# Patient Record
Sex: Male | Born: 1965 | Race: White | Hispanic: No
Health system: Southern US, Community
[De-identification: ages and names within clinical notes are randomized; demographics above are authoritative.]

## PROBLEM LIST (undated history)

## (undated) DIAGNOSIS — I2510 Atherosclerotic heart disease of native coronary artery without angina pectoris: Secondary | ICD-10-CM

## (undated) DIAGNOSIS — E785 Hyperlipidemia, unspecified: Secondary | ICD-10-CM

## (undated) DIAGNOSIS — E7800 Pure hypercholesterolemia, unspecified: Secondary | ICD-10-CM

## (undated) DIAGNOSIS — F419 Anxiety disorder, unspecified: Secondary | ICD-10-CM

## (undated) HISTORY — DX: Hyperlipidemia, unspecified: E78.5

## (undated) HISTORY — PX: NERVE GRAFT: SHX721

## (undated) HISTORY — DX: Pure hypercholesterolemia, unspecified: E78.00

## (undated) HISTORY — DX: Atherosclerotic heart disease of native coronary artery without angina pectoris: I25.10

## (undated) HISTORY — DX: Anxiety disorder, unspecified: F41.9

---

## 2018-03-01 DIAGNOSIS — S80862A Insect bite (nonvenomous), left lower leg, initial encounter: Secondary | ICD-10-CM | POA: Diagnosis not present

## 2018-06-20 DIAGNOSIS — Z125 Encounter for screening for malignant neoplasm of prostate: Secondary | ICD-10-CM | POA: Diagnosis not present

## 2018-06-20 DIAGNOSIS — E7800 Pure hypercholesterolemia, unspecified: Secondary | ICD-10-CM | POA: Diagnosis not present

## 2018-06-20 DIAGNOSIS — Z0000 Encounter for general adult medical examination without abnormal findings: Secondary | ICD-10-CM | POA: Diagnosis not present

## 2018-06-20 DIAGNOSIS — Z131 Encounter for screening for diabetes mellitus: Secondary | ICD-10-CM | POA: Diagnosis not present

## 2018-06-28 DIAGNOSIS — Z0000 Encounter for general adult medical examination without abnormal findings: Secondary | ICD-10-CM | POA: Diagnosis not present

## 2018-07-04 DIAGNOSIS — J209 Acute bronchitis, unspecified: Secondary | ICD-10-CM | POA: Diagnosis not present

## 2018-10-03 DIAGNOSIS — K625 Hemorrhage of anus and rectum: Secondary | ICD-10-CM | POA: Diagnosis not present

## 2019-04-07 DIAGNOSIS — R202 Paresthesia of skin: Secondary | ICD-10-CM | POA: Diagnosis not present

## 2019-04-07 DIAGNOSIS — B078 Other viral warts: Secondary | ICD-10-CM | POA: Diagnosis not present

## 2019-04-07 DIAGNOSIS — D229 Melanocytic nevi, unspecified: Secondary | ICD-10-CM | POA: Diagnosis not present

## 2019-05-19 DIAGNOSIS — Z20822 Contact with and (suspected) exposure to covid-19: Secondary | ICD-10-CM | POA: Diagnosis not present

## 2019-05-25 DIAGNOSIS — U071 COVID-19: Secondary | ICD-10-CM | POA: Diagnosis not present

## 2019-06-05 DIAGNOSIS — Z03818 Encounter for observation for suspected exposure to other biological agents ruled out: Secondary | ICD-10-CM | POA: Diagnosis not present

## 2019-09-29 DIAGNOSIS — K921 Melena: Secondary | ICD-10-CM | POA: Diagnosis not present

## 2019-12-15 DIAGNOSIS — M25512 Pain in left shoulder: Secondary | ICD-10-CM | POA: Diagnosis not present

## 2019-12-15 DIAGNOSIS — M5412 Radiculopathy, cervical region: Secondary | ICD-10-CM | POA: Diagnosis not present

## 2020-05-17 DIAGNOSIS — E7800 Pure hypercholesterolemia, unspecified: Secondary | ICD-10-CM | POA: Diagnosis not present

## 2020-05-17 DIAGNOSIS — K644 Residual hemorrhoidal skin tags: Secondary | ICD-10-CM | POA: Diagnosis not present

## 2020-05-17 DIAGNOSIS — F419 Anxiety disorder, unspecified: Secondary | ICD-10-CM | POA: Diagnosis not present

## 2020-09-10 DIAGNOSIS — K602 Anal fissure, unspecified: Secondary | ICD-10-CM | POA: Diagnosis not present

## 2020-09-10 DIAGNOSIS — K625 Hemorrhage of anus and rectum: Secondary | ICD-10-CM | POA: Diagnosis not present

## 2020-09-10 DIAGNOSIS — K649 Unspecified hemorrhoids: Secondary | ICD-10-CM | POA: Diagnosis not present

## 2020-10-11 DIAGNOSIS — K602 Anal fissure, unspecified: Secondary | ICD-10-CM | POA: Diagnosis not present

## 2020-10-11 DIAGNOSIS — K625 Hemorrhage of anus and rectum: Secondary | ICD-10-CM | POA: Diagnosis not present

## 2020-11-13 DIAGNOSIS — Z131 Encounter for screening for diabetes mellitus: Secondary | ICD-10-CM | POA: Diagnosis not present

## 2020-11-13 DIAGNOSIS — Z125 Encounter for screening for malignant neoplasm of prostate: Secondary | ICD-10-CM | POA: Diagnosis not present

## 2020-11-13 DIAGNOSIS — E7800 Pure hypercholesterolemia, unspecified: Secondary | ICD-10-CM | POA: Diagnosis not present

## 2020-11-18 DIAGNOSIS — Z0000 Encounter for general adult medical examination without abnormal findings: Secondary | ICD-10-CM | POA: Diagnosis not present

## 2020-12-04 DIAGNOSIS — K921 Melena: Secondary | ICD-10-CM | POA: Diagnosis not present

## 2020-12-04 DIAGNOSIS — K649 Unspecified hemorrhoids: Secondary | ICD-10-CM | POA: Diagnosis not present

## 2021-01-23 ENCOUNTER — Other Ambulatory Visit (HOSPITAL_COMMUNITY): Payer: Self-pay | Admitting: Family Medicine

## 2021-03-03 DIAGNOSIS — K625 Hemorrhage of anus and rectum: Secondary | ICD-10-CM | POA: Diagnosis not present

## 2021-03-21 ENCOUNTER — Ambulatory Visit (HOSPITAL_COMMUNITY)
Admission: RE | Admit: 2021-03-21 | Discharge: 2021-03-21 | Disposition: A | Discharge status: Home / Self Care | Payer: Self-pay | Source: Ambulatory Visit | Attending: Family Medicine | Admitting: Family Medicine

## 2021-03-21 ENCOUNTER — Other Ambulatory Visit: Payer: Self-pay

## 2021-03-21 DIAGNOSIS — Z136 Encounter for screening for cardiovascular disorders: Secondary | ICD-10-CM | POA: Insufficient documentation

## 2021-04-10 DIAGNOSIS — K648 Other hemorrhoids: Secondary | ICD-10-CM | POA: Diagnosis not present

## 2021-05-09 DIAGNOSIS — K641 Second degree hemorrhoids: Secondary | ICD-10-CM | POA: Diagnosis not present

## 2021-05-16 ENCOUNTER — Other Ambulatory Visit: Payer: Self-pay

## 2021-05-16 ENCOUNTER — Ambulatory Visit: Payer: BC Managed Care – PPO | Admitting: Cardiology

## 2021-05-16 VITALS — BP 130/80 | HR 77 | Ht 67.0 in | Wt 248.0 lb

## 2021-05-16 DIAGNOSIS — Z789 Other specified health status: Secondary | ICD-10-CM

## 2021-05-16 DIAGNOSIS — E785 Hyperlipidemia, unspecified: Secondary | ICD-10-CM

## 2021-05-16 DIAGNOSIS — E782 Mixed hyperlipidemia: Secondary | ICD-10-CM | POA: Diagnosis not present

## 2021-05-16 DIAGNOSIS — I2510 Atherosclerotic heart disease of native coronary artery without angina pectoris: Secondary | ICD-10-CM

## 2021-05-16 DIAGNOSIS — I2583 Coronary atherosclerosis due to lipid rich plaque: Secondary | ICD-10-CM | POA: Diagnosis not present

## 2021-05-16 MED ORDER — ASPIRIN EC 81 MG PO TBEC
81.0000 mg | DELAYED_RELEASE_TABLET | Freq: Every day | ORAL | 3 refills | Status: AC
Start: 2021-05-16 — End: ?

## 2021-05-16 NOTE — Progress Notes (Signed)
Cardiology Office Note:    Date:  05/16/2021   ID:  Scott ChiquitoJonathan Vandunk, DOB Mar 26, 1966, MRN [id number]  PCP:  Trey SailorsPa, [facility]   [facility] Providers Cardiologist:  Donato SchultzMark Saragrace Selke, MD     Referring MD: Darrow BussingKoirala, Dibas, MD    History of Present Illness:    Scott Stephens is a 56 y.o. male here for the evaluation of elevated coronary artery calcium score as well as statin intolerance at the request of Dr. Docia ChuckKoirala.  Coronary calcium score personally reviewed and interpreted 03/21/2021: Left main: 0   Left anterior descending artery: 53.6   Left circumflex artery: 0   Right coronary artery: 0   Total: 53.6   Percentile: 74th  Outside labs-hemoglobin 13.2  He has had trouble in the past taking statin medications.  He has tried simvastatin 20 mg twice a week.  He has had muscle aches with Lipitor as well as Crestor.  States that he had weakness as well when going up stairs if he took it the day before while working at [employer] for instance.  He is quite active, walks his dogs, 3 miles at times in the woods.  No anginal symptoms.  Mild shortness of breath with activity which he attributes to weight gain.  He does vape.  Does not smoke tobacco.  Has had chronic intermittent rectal bleeding followed by GI.  Seems to be under good control after banding of what sounds like a hemorrhoid.  Anxiety has been stable on sertraline.  Father is alive in his 5490s, he has had TAVR secondary to calcification of aortic valve.  His mother died at age 55 from myocardial infarction.  He has a brother and a sister, sister with heart disease in her late 6050s.  Past Medical History:  Diagnosis Date   Anxiety    CAD (coronary artery disease)    Hypercholesteremia    Hyperlipidemia       Current Medications: Current Meds  Medication Sig   aspirin EC 81 MG tablet Take 1 tablet (81 mg total) by mouth daily. Swallow whole.   diltiazem 2 % GEL Apply 1 application topically 2 (two)  times daily.   hydrocortisone (ANUSOL-HC) 2.5 % rectal cream Place 1 application rectally 2 (two) times daily.   ibuprofen (ADVIL) 200 MG tablet Take 200 mg by mouth every 6 (six) hours as needed.   sertraline (ZOLOFT) 50 MG tablet Take 50 mg by mouth daily.   simvastatin (ZOCOR) 20 MG tablet Take 20 mg by mouth daily.     Allergies:   Crestor [rosuvastatin] and Lipitor [atorvastatin]   Social History   Socioeconomic History   Marital status: Unknown    Spouse name: Not on file   Number of children: Not on file   Years of education: Not on file   Highest education level: Not on file  Occupational History   Not on file  Tobacco Use   Smoking status: Not on file   Smokeless tobacco: Not on file  Substance and Sexual Activity   Alcohol use: Not on file   Drug use: Not on file   Sexual activity: Not on file  Other Topics Concern   Not on file  Social History Narrative   Not on file   Social Determinants of Health   Financial Resource Strain: Not on file  Food Insecurity: Not on file  Transportation Needs: Not on file  Physical Activity: Not on file  Stress: Not on file  Social Connections: Not  on file     Family History: The patient's family history includes Diabetes in his father; Heart Problems in his father.  ROS:   Please see the history of present illness.    Denies any fevers chills nausea vomiting syncope bleeding all other systems reviewed and are negative.  EKGs/Labs/Other Studies Reviewed:    The following studies were reviewed today: Coronary calcium score personally reviewed and interpreted with him so he could visualize his LAD.  EKG:  EKG is  ordered today.  The ekg ordered today demonstrates sinus rhythm 77 no other abnormalities  Recent Labs: No results found for requested labs within last 8760 hours.  Recent Lipid Panel No results found for: CHOL, TRIG, HDL, CHOLHDL, VLDL, LDLCALC, LDLDIRECT   Risk Assessment/Calculations:               Physical Exam:    VS:  BP 130/80 (BP Location: Left Arm, Patient Position: Sitting, Cuff Size: Normal)    Pulse 77    Ht 5\' 7"  (1.702 m)    Wt 248 lb (112.5 kg)    SpO2 93%    BMI 38.84 kg/m     Wt Readings from Last 3 Encounters:  05/16/21 248 lb (112.5 kg)     GEN:  Well nourished, well developed in no acute distress HEENT: Normal NECK: No JVD; No carotid bruits LYMPHATICS: No lymphadenopathy CARDIAC: RRR, no murmurs, no rubs, gallops RESPIRATORY:  Clear to auscultation without rales, wheezing or rhonchi  ABDOMEN: Protuberant soft, non-tender, non-distended MUSCULOSKELETAL:  No edema; No deformity  SKIN: Warm and dry NEUROLOGIC:  Alert and oriented x 3 PSYCHIATRIC:  Normal affect   ASSESSMENT:    1. Coronary artery disease involving native coronary artery of native heart without angina pectoris   2. Statin intolerance   3. Hyperlipidemia, unspecified hyperlipidemia type   4. Coronary artery disease due to lipid rich plaque   5. Mixed hyperlipidemia    PLAN:    In order of problems listed above:  Coronary artery disease involving native coronary artery of native heart without angina pectoris Calcium score 54 in the LAD.  Reviewed with him the images.  Described the pathophysiology behind this.  His percentile is 74th percentile.  He has had trouble in the past with statins Lipitor or Crestor for instance have caused muscle weakness and discomfort the day after he takes them.  He is trying to take simvastatin 20 mg 2 to maybe 3 times a week if possible.  Because of this, I will have him sit down with our lipid clinic for alternative treatment strategies.  I would like for his LDL goal to be less than 70 given his coronary artery disease/calcification.  Thankfully, he is not having any anginal symptoms.  Of course if symptoms do develop, we will have low threshold for further cardiac testing.  For now aggressive medical management/prevention, stabilization.  I would like for him  to be on an aspirin 81 mg as well.  Watch for any signs of further rectal bleeding.  This seems to have subsided.  I have also asked him to work on diet, exercise.  Asked him to cut back/stop vaping.  Mixed hyperlipidemia Currently on low-dose simvastatin twice a week.  I will have him sit down with our lipid clinic for further suggestions alternatives  Statin intolerance Has had difficulty with Crestor and Lipitor in the past, leg weakness discomfort         Medication Adjustments/Labs and Tests Ordered: Current medicines are  reviewed at length with the patient today.  Concerns regarding medicines are outlined above.  Orders Placed This Encounter  Procedures   AMB Referral to [facility] Pharm-[facility]   EKG 12-Lead   Meds ordered this encounter  Medications   aspirin EC 81 MG tablet    Sig: Take 1 tablet (81 mg total) by mouth daily. Swallow whole.    Dispense:  90 tablet    Refill:  3    Patient Instructions  Medication Instructions:  Please start Aspirin 81 mg a day. Continue all other medications as listed. *If you need a refill on your cardiac medications before your next appointment, please call your pharmacy*   You have been referred to our Lipid Clinic (here at this office) to discuss statin intolerance.    Follow-Up: At [facility], you and your health needs are our priority.  As part of our continuing mission to provide you with exceptional heart care, we have created designated Provider Care Teams.  These Care Teams include your primary Cardiologist (physician) and Advanced Practice Providers (APPs -  Physician Assistants and Nurse Practitioners) who all work together to provide you with the care you need, when you need it.  We recommend signing up for the patient portal called "MyChart".  Sign up information is provided on this After Visit Summary.  MyChart is used to connect with patients for Virtual Visits (Telemedicine).  Patients are able to view lab/test results,  encounter notes, upcoming appointments, etc.  Non-urgent messages can be sent to your provider as well.   To learn more about what you can do with MyChart, go to [email or web address].    Your next appointment:   1 year(s)  The format for your next appointment:   In Person  Provider:   Donato Schultz, MD     Thank you for choosing [facility]!!      Signed, Donato Schultz, MD  05/16/2021 10:04 AM    [facility]

## 2021-05-16 NOTE — Patient Instructions (Signed)
Medication Instructions:  Please start Aspirin 81 mg a day. Continue all other medications as listed. *If you need a refill on your cardiac medications before your next appointment, please call your pharmacy*   You have been referred to our Lipid Clinic (here at this office) to discuss statin intolerance.    Follow-Up: At [facility], you and your health needs are our priority.  As part of our continuing mission to provide you with exceptional heart care, we have created designated Provider Care Teams.  These Care Teams include your primary Cardiologist (physician) and Advanced Practice Providers (APPs -  Physician Assistants and Nurse Practitioners) who all work together to provide you with the care you need, when you need it.  We recommend signing up for the patient portal called "MyChart".  Sign up information is provided on this After Visit Summary.  MyChart is used to connect with patients for Virtual Visits (Telemedicine).  Patients are able to view lab/test results, encounter notes, upcoming appointments, etc.  Non-urgent messages can be sent to your provider as well.   To learn more about what you can do with MyChart, go to [email or web address].    Your next appointment:   1 year(s)  The format for your next appointment:   In Person  Provider:   Donato Schultz, MD     Thank you for choosing [facility]!!

## 2021-05-16 NOTE — Assessment & Plan Note (Signed)
Currently on low-dose simvastatin twice a week.  I will have him sit down with our lipid clinic for further suggestions alternatives

## 2021-05-16 NOTE — Assessment & Plan Note (Signed)
Calcium score 54 in the LAD.  Reviewed with him the images.  Described the pathophysiology behind this.  His percentile is 74th percentile.  He has had trouble in the past with statins Lipitor or Crestor for instance have caused muscle weakness and discomfort the day after he takes them.  He is trying to take simvastatin 20 mg 2 to maybe 3 times a week if possible.  Because of this, I will have him sit down with our lipid clinic for alternative treatment strategies.  I would like for his LDL goal to be less than 70 given his coronary artery disease/calcification.  Thankfully, he is not having any anginal symptoms.  Of course if symptoms do develop, we will have low threshold for further cardiac testing.  For now aggressive medical management/prevention, stabilization.  I would like for him to be on an aspirin 81 mg as well.  Watch for any signs of further rectal bleeding.  This seems to have subsided.  I have also asked him to work on diet, exercise.  Asked him to cut back/stop vaping.

## 2021-05-16 NOTE — Assessment & Plan Note (Signed)
Has had difficulty with Crestor and Lipitor in the past, leg weakness discomfort

## 2021-06-13 ENCOUNTER — Ambulatory Visit (INDEPENDENT_AMBULATORY_CARE_PROVIDER_SITE_OTHER): Payer: BC Managed Care – PPO | Admitting: Pharmacist

## 2021-06-13 ENCOUNTER — Encounter: Payer: Self-pay | Admitting: Pharmacist

## 2021-06-13 ENCOUNTER — Other Ambulatory Visit: Payer: Self-pay

## 2021-06-13 DIAGNOSIS — E782 Mixed hyperlipidemia: Secondary | ICD-10-CM | POA: Diagnosis not present

## 2021-06-13 DIAGNOSIS — Z789 Other specified health status: Secondary | ICD-10-CM | POA: Diagnosis not present

## 2021-06-13 DIAGNOSIS — I2510 Atherosclerotic heart disease of native coronary artery without angina pectoris: Secondary | ICD-10-CM

## 2021-06-13 NOTE — Patient Instructions (Signed)
I will submit a prior authorization for Repatha.  I will call you once I hear back  Please call me at [phone] with any questions.  You may continue to take simvastatin 20mg  twice a week.

## 2021-06-13 NOTE — Progress Notes (Signed)
Patient ID: Scott Stephens                 DOB: [birth year]                    MRN: [id number]     HPI: Scott Stephens is a 56 y.o. male patient referred to lipid clinic by Dr. Marlou Porch. PMH is significant for CAD CAC score of 53 (74th percentile), HLD and anxiety. Patient has history of issues tolerating statins. He was referred to lipid clinic to discuss PCSK9i.   Patient presents today to lipid clinic. He is still taking simvastatin 2 times a week or as he can tolerate. He is very active at work and walks his dogs 3-5 miles on the weekends. Tries to eat healthy even though he states he is overweight. He has tried several dosing regimens for statins and he has very crippling muscle aches a few days later with all of them.  Current Medications: simvastatin 20 mg twice a week Intolerances: simvastatin 20 mg twice a week, rosuvastatin, atorvastatin (muscle aches) Risk Factors: CAC 53 LDL goal: <70  Diet: vegetables for dinner a few times a week  Exercise: 3-5 miles on Sat and Sun with dogs, 10,000 steps per day  Family History:  Family History  Problem Relation Age of Onset   Heart Problems Father    Diabetes Father     Social History: vapes a little- working to quite [employer] Social History   Socioeconomic History   Marital status: Unknown    Spouse name: Not on file   Number of children: Not on file   Years of education: Not on file   Highest education level: Not on file  Occupational History   Not on file  Tobacco Use   Smoking status: Not on file   Smokeless tobacco: Not on file  Substance and Sexual Activity   Alcohol use: Not on file   Drug use: Not on file   Sexual activity: Not on file  Other Topics Concern   Not on file  Social History Narrative   Not on file   Social Determinants of Health   Financial Resource Strain: Not on file  Food Insecurity: Not on file  Transportation Needs: Not on file  Physical Activity: Not on file  Stress: Not on file  Social  Connections: Not on file  Intimate Partner Violence: Not on file    Labs: 11/13/20 TC 250, TG 193, HDL 65, LDL-C 150   Past Medical History:  Diagnosis Date   Anxiety    CAD (coronary artery disease)    Hypercholesteremia    Hyperlipidemia     Current Outpatient Medications on File Prior to Visit  Medication Sig Dispense Refill   aspirin EC 81 MG tablet Take 1 tablet (81 mg total) by mouth daily. Swallow whole. 90 tablet 3   diltiazem 2 % GEL Apply 1 application topically 2 (two) times daily.     hydrocortisone (ANUSOL-HC) 2.5 % rectal cream Place 1 application rectally 2 (two) times daily.     ibuprofen (ADVIL) 200 MG tablet Take 200 mg by mouth every 6 (six) hours as needed.     sertraline (ZOLOFT) 50 MG tablet Take 50 mg by mouth daily.     simvastatin (ZOCOR) 20 MG tablet Take 20 mg by mouth daily.     No current facility-administered medications on file prior to visit.    Allergies  Allergen Reactions   Crestor [Rosuvastatin]  Muscle aches   Lipitor [Atorvastatin]     Muscle aches    Assessment/Plan:  1. Hyperlipidemia - LDL-C is above goal of <70 due to CAC score. Discussed PCSK9i with patient, reviewed injection technique, side effects and cost with patient. He is agreeable to try. We discussed that he can continue to take statin as tolerated. Will submit prior authorization for Repatha.  Thank you,  Ramond Dial, Pharm.D, BCPS, CPP [place]  [id number] [place], [place] [address]  Phone: [phone]; Fax: [phone]

## 2021-06-16 ENCOUNTER — Telehealth: Payer: Self-pay | Admitting: Pharmacist

## 2021-06-16 DIAGNOSIS — K641 Second degree hemorrhoids: Secondary | ICD-10-CM | POA: Diagnosis not present

## 2021-06-16 NOTE — Telephone Encounter (Signed)
Was not able to submit PA via covermymeds. PA form from [employer] website was faxed.

## 2021-06-19 ENCOUNTER — Telehealth: Payer: Self-pay | Admitting: Pharmacist

## 2021-06-19 DIAGNOSIS — E782 Mixed hyperlipidemia: Secondary | ICD-10-CM

## 2021-06-19 MED ORDER — REPATHA SURECLICK 140 MG/ML ~~LOC~~ SOAJ: 1.0000 "pen " | SUBCUTANEOUS | 11 refills | Status: AC

## 2021-06-19 NOTE — Telephone Encounter (Signed)
Repatha approved through 12/15/21. Rx sent to pharmacy. Pt will get labs at [facility] in 3 months. Orders placed and mailed to patient  RxBin: [id number] RxPCN: [id number] RxGrp: [id number] ID: [id number]  Copay card called into [facility]. Cost $5.

## 2021-06-27 DIAGNOSIS — H01004 Unspecified blepharitis left upper eyelid: Secondary | ICD-10-CM | POA: Diagnosis not present

## 2021-11-19 DIAGNOSIS — Z125 Encounter for screening for malignant neoplasm of prostate: Secondary | ICD-10-CM | POA: Diagnosis not present

## 2021-11-19 DIAGNOSIS — Z130 Encounter for screening for diseases of the blood and blood-forming organs and certain disorders involving the immune mechanism: Secondary | ICD-10-CM | POA: Diagnosis not present

## 2021-11-19 DIAGNOSIS — Z0000 Encounter for general adult medical examination without abnormal findings: Secondary | ICD-10-CM | POA: Diagnosis not present

## 2021-11-19 DIAGNOSIS — E7800 Pure hypercholesterolemia, unspecified: Secondary | ICD-10-CM | POA: Diagnosis not present

## 2021-11-19 DIAGNOSIS — Z131 Encounter for screening for diabetes mellitus: Secondary | ICD-10-CM | POA: Diagnosis not present

## 2021-11-24 DIAGNOSIS — I2510 Atherosclerotic heart disease of native coronary artery without angina pectoris: Secondary | ICD-10-CM | POA: Diagnosis not present

## 2021-11-24 DIAGNOSIS — Z0000 Encounter for general adult medical examination without abnormal findings: Secondary | ICD-10-CM | POA: Diagnosis not present

## 2021-11-26 ENCOUNTER — Telehealth: Payer: Self-pay | Admitting: Pharmacist

## 2021-11-26 NOTE — Telephone Encounter (Signed)
Patient started on Repatha is Feb. Has not gone for labs. PA will need to be renewed next month and labs will be needed if patient is still on therapy. Tried to call pt but no answer and VM was full.

## 2021-11-29 DIAGNOSIS — S299XXA Unspecified injury of thorax, initial encounter: Secondary | ICD-10-CM | POA: Diagnosis not present

## 2021-11-29 DIAGNOSIS — M62830 Muscle spasm of back: Secondary | ICD-10-CM | POA: Diagnosis not present

## 2022-04-19 DIAGNOSIS — J018 Other acute sinusitis: Secondary | ICD-10-CM | POA: Diagnosis not present

## 2022-05-05 ENCOUNTER — Other Ambulatory Visit (HOSPITAL_COMMUNITY): Payer: Self-pay

## 2022-05-05 ENCOUNTER — Telehealth: Payer: Self-pay

## 2022-05-05 NOTE — Telephone Encounter (Signed)
Pharmacy Patient Advocate Encounter   Received notification that prior authorization for REPATHA 140 MG/ML INJ is needed.    UNABLE TO FIND PATIENT INSURANCE THROUGH ELIGIBILITY SEARCH IN [place]. PATIENT WILL NEED TO PROVIDE NEW INSURANCE TO INITIATE RENEWAL.   I HAVE TRIED TO CALL PATIENT TO OBTAIN INFO.   PLEASE ADVISE.    Karie Soda, CPhT Pharmacy Patient Advocate Specialist Direct Number: [phone] Fax: [phone]

## 2022-05-19 ENCOUNTER — Other Ambulatory Visit (HOSPITAL_COMMUNITY): Payer: Self-pay

## 2022-05-19 NOTE — Telephone Encounter (Signed)
Attempted to call patient again. No way to leave a voicemail and it's not ringing. I called the Pharmacy that he fills with to see if they have updated info and they have the same thing we do, which expired 05/03/22. Patient will need to provide clinic or pharmacy with new ID card to move forward with PA.

## 2022-05-26 ENCOUNTER — Other Ambulatory Visit (HOSPITAL_COMMUNITY): Payer: Self-pay

## 2022-06-10 ENCOUNTER — Other Ambulatory Visit (HOSPITAL_COMMUNITY): Payer: Self-pay

## 2022-06-18 IMAGING — CT CT CARDIAC CORONARY ARTERY CALCIUM SCORE
2 series · 15 of 20 positions shown, 17 images · non-contrast
Comparison: None.
COMPARISON: None.

Addendum:
EXAM:
OVER-READ INTERPRETATION  CT CHEST

The following report is an over-read performed by radiologist Dr.
over-read does not include interpretation of cardiac or coronary
anatomy or pathology. The calcium score interpretation by the
cardiologist is attached.
CLINICAL DATA: Cardiovascular Disease Risk stratification
Coronary Calcium Score
TECHNIQUE: A gated, non-contrast computed tomography scan of the heart was
performed using 3mm slice thickness. Axial images were analyzed on a
dedicated workstation. Calcium scoring of the coronary arteries was
performed using the Agatston method.

[Series 3: cascseq 2.0 b35f 70% · axial · 0.34mm/px · z∈[+1226,+1316]mm · 7 of 69 slices shown]
[im 8/69  vessel]
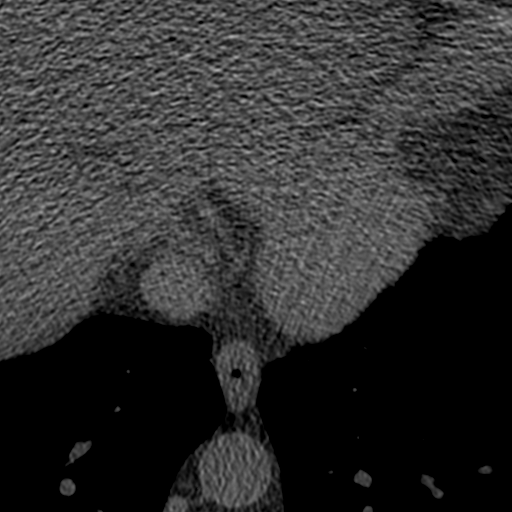
[im 16/69  vessel]
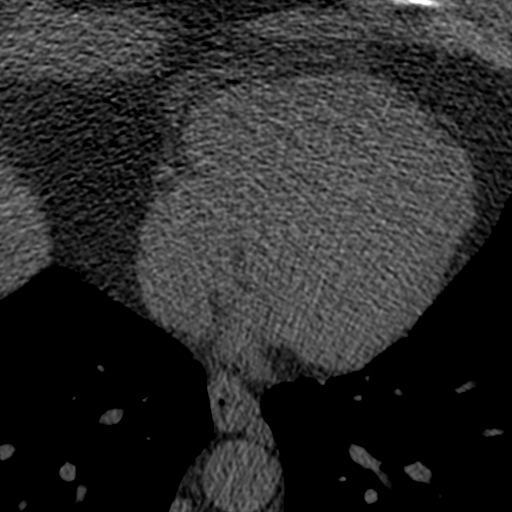
[im 23/69  vessel]
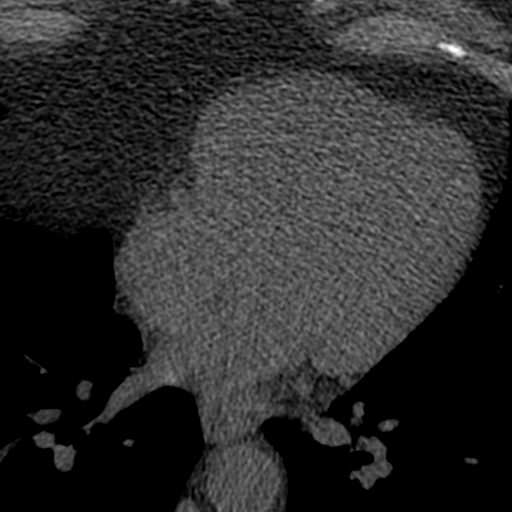
[im 31/69  vessel]
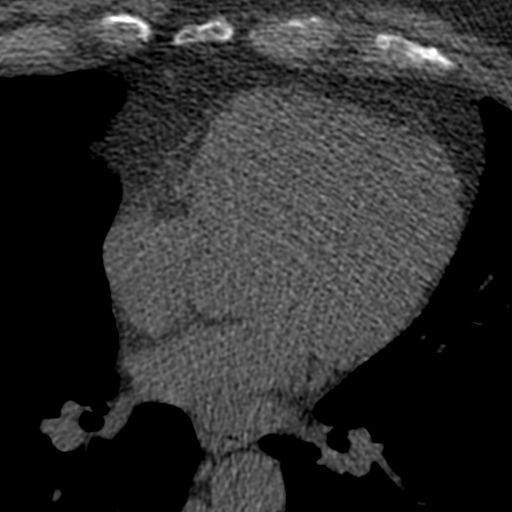
[im 38/69  vessel]
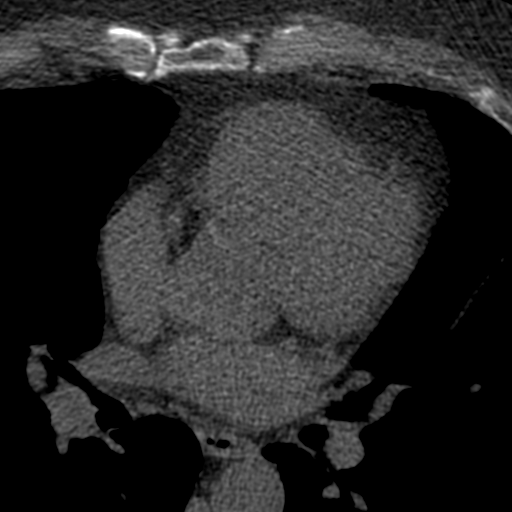
[im 46/69  vessel]
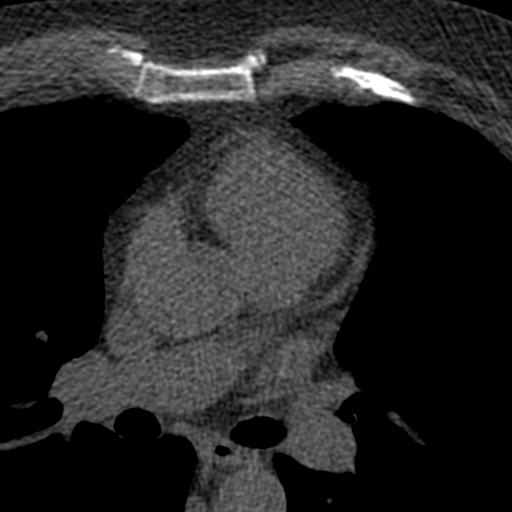
[im 53/69  vessel]
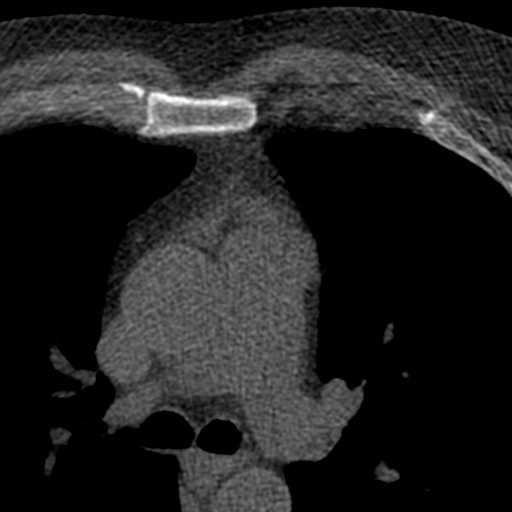

[Series 4: ax st full fov · axial · 0.66mm/px · z∈[+1226,+1332]mm · 8 of 69 slices shown, 10 images]
[im 8/69  vessel]
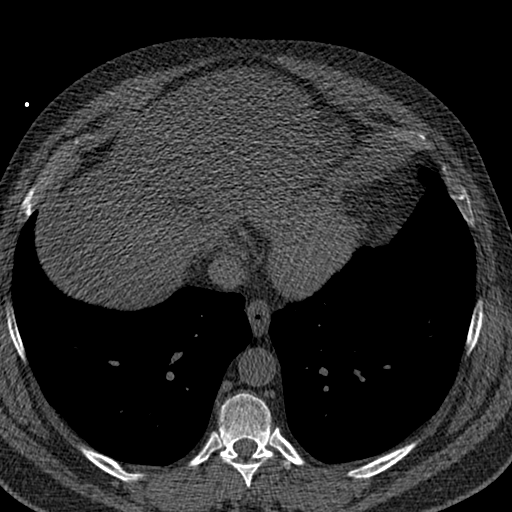
[im 8/69  lung]
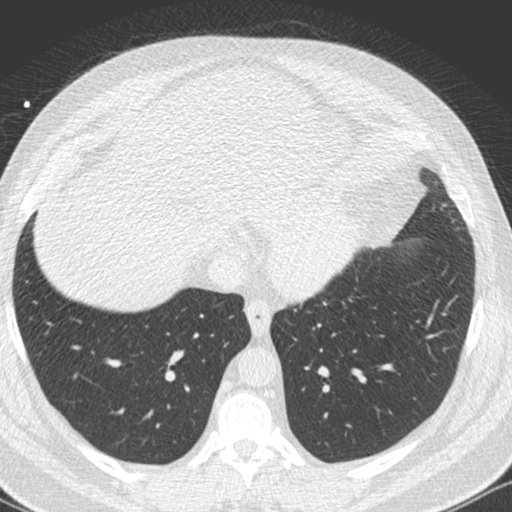
[im 16/69  vessel]
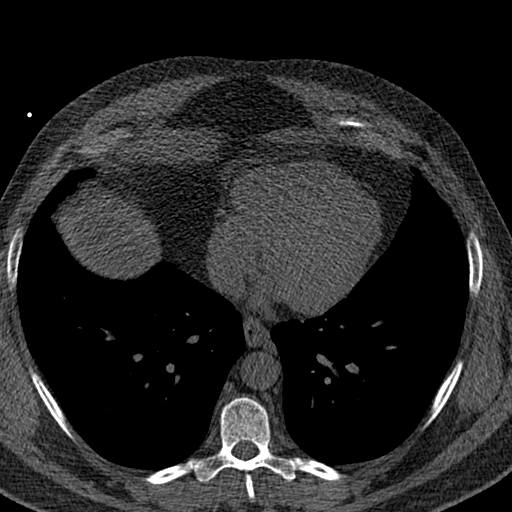
[im 23/69  vessel]
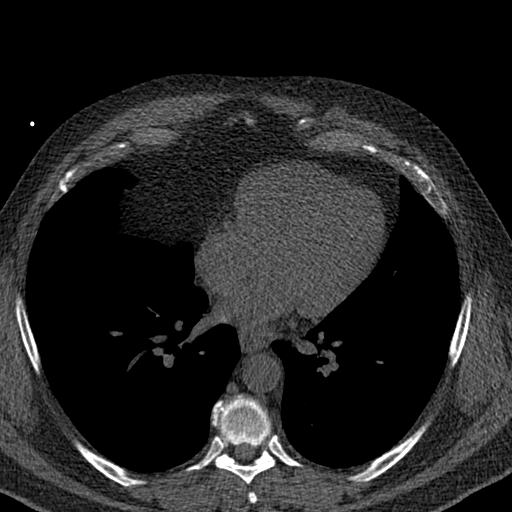
[im 31/69  vessel]
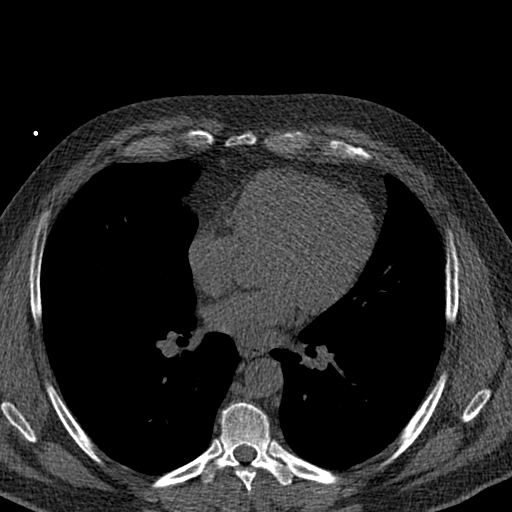
[im 38/69  vessel]
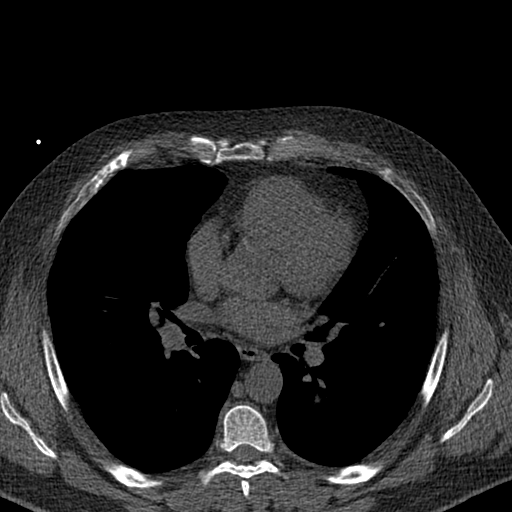
[im 38/69  lung]
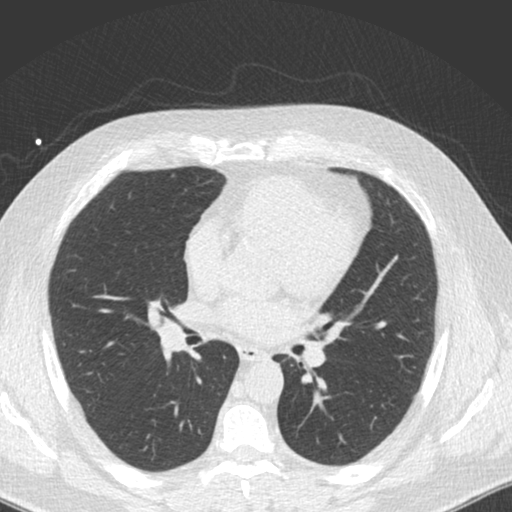
[im 46/69  vessel]
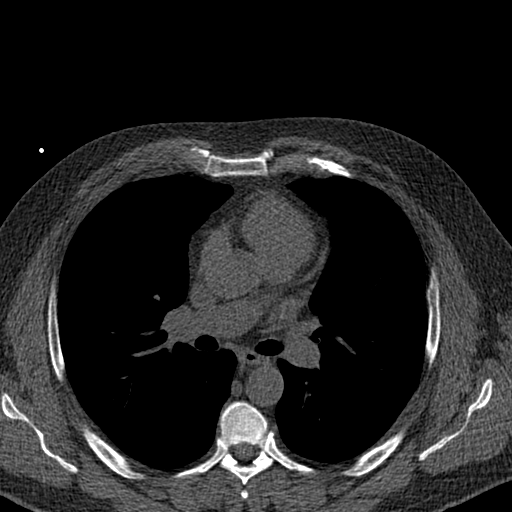
[im 53/69  vessel]
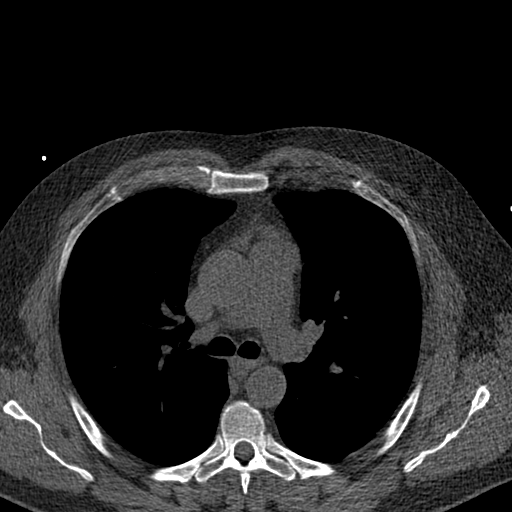
[im 61/69  vessel]
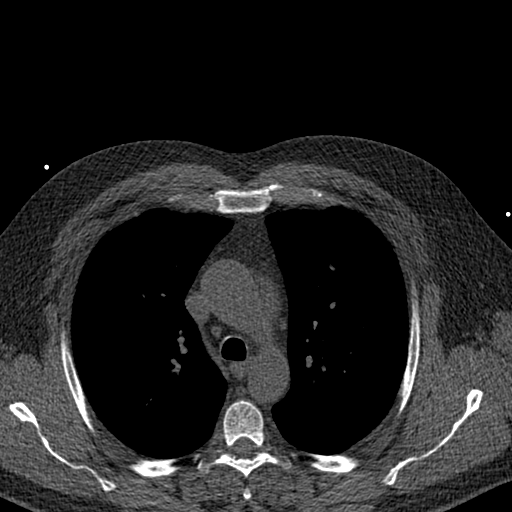

[15 of 20 positions shown; findings below may reference images not displayed]

FINDINGS: Vascular: Normal aortic caliber.

Mediastinum/Nodes: No imaged thoracic adenopathy.

Lungs/Pleura: No pleural fluid.  Clear imaged lungs.

Upper Abdomen: Normal imaged portions of the liver.

Musculoskeletal: No acute osseous abnormality. Right hemidiaphragm
elevation.
IMPRESSION: No acute findings in the imaged extracardiac chest.
FINDINGS: Coronary arteries: Normal origins.

Coronary Calcium Score:

Left main: 0

Left anterior descending artery:

Left circumflex artery: 0

Right coronary artery: 0

Total:

Percentile: 74th

Pericardium: Normal.

Ascending Aorta: Normal caliber.

Non-cardiac: See separate report from [REDACTED].
IMPRESSION: Coronary calcium score of 53.6. This was 74th percentile for age-,
race-, and sex-matched controls.



If CAC=0, it is reasonable to withhold statin therapy and reassess
in 5 to 10 years, as long as higher risk conditions are absent
(diabetes mellitus, family history of premature CHD in first degree
relatives (males <55 years; females <65 years), cigarette smoking,
or LDL >=190 mg/dL).

If CAC is 1 to 99, it is reasonable to initiate statin therapy for
patients >=55 years of age.

If CAC is >=100 or >=75th percentile, it is reasonable to initiate
statin therapy at any age.

Cardiology referral should be considered for patients with CAC
scores >=400 or >=75th percentile.

*6933 AHA/ACC/AACVPR/AAPA/ABC/WAR/HOMAR/MIRSALI/Garga/FULERA/TULABA/PERRARD
Guideline on the Management of Blood Cholesterol: A Report of the
American College of Cardiology/American Heart Association Task Force
on Clinical Practice Guidelines. J Am Coll Cardiol.
5771;73(24):9151-9517.

*** End of Addendum ***
EXAM:
OVER-READ INTERPRETATION  CT CHEST

The following report is an over-read performed by radiologist Dr.
over-read does not include interpretation of cardiac or coronary
anatomy or pathology. The calcium score interpretation by the
cardiologist is attached.
FINDINGS: Vascular: Normal aortic caliber.

Mediastinum/Nodes: No imaged thoracic adenopathy.

Lungs/Pleura: No pleural fluid.  Clear imaged lungs.

Upper Abdomen: Normal imaged portions of the liver.

Musculoskeletal: No acute osseous abnormality. Right hemidiaphragm
elevation.
IMPRESSION: No acute findings in the imaged extracardiac chest.

## 2022-08-01 ENCOUNTER — Telehealth: Payer: BC Managed Care – PPO | Admitting: Family Medicine

## 2022-08-01 DIAGNOSIS — J111 Influenza due to unidentified influenza virus with other respiratory manifestations: Secondary | ICD-10-CM | POA: Diagnosis not present

## 2022-08-01 MED ORDER — BENZONATATE 200 MG PO CAPS: 200.0000 mg | ORAL_CAPSULE | Freq: Two times a day (BID) | ORAL | 0 refills | Status: AC | PRN

## 2022-08-01 MED ORDER — OSELTAMIVIR PHOSPHATE 75 MG PO CAPS: 75.0000 mg | ORAL_CAPSULE | Freq: Two times a day (BID) | ORAL | 0 refills | Status: AC

## 2022-11-20 DIAGNOSIS — Z0000 Encounter for general adult medical examination without abnormal findings: Secondary | ICD-10-CM | POA: Diagnosis not present

## 2022-11-20 DIAGNOSIS — Z125 Encounter for screening for malignant neoplasm of prostate: Secondary | ICD-10-CM | POA: Diagnosis not present

## 2022-11-20 DIAGNOSIS — Z131 Encounter for screening for diabetes mellitus: Secondary | ICD-10-CM | POA: Diagnosis not present

## 2022-11-20 DIAGNOSIS — E038 Other specified hypothyroidism: Secondary | ICD-10-CM | POA: Diagnosis not present

## 2022-11-20 DIAGNOSIS — E7800 Pure hypercholesterolemia, unspecified: Secondary | ICD-10-CM | POA: Diagnosis not present

## 2022-11-20 DIAGNOSIS — I2510 Atherosclerotic heart disease of native coronary artery without angina pectoris: Secondary | ICD-10-CM | POA: Diagnosis not present

## 2022-11-25 DIAGNOSIS — Z0000 Encounter for general adult medical examination without abnormal findings: Secondary | ICD-10-CM | POA: Diagnosis not present

## 2022-12-30 DIAGNOSIS — W57XXXA Bitten or stung by nonvenomous insect and other nonvenomous arthropods, initial encounter: Secondary | ICD-10-CM | POA: Diagnosis not present

## 2022-12-30 DIAGNOSIS — T07XXXA Unspecified multiple injuries, initial encounter: Secondary | ICD-10-CM | POA: Diagnosis not present

## 2023-02-24 DIAGNOSIS — E038 Other specified hypothyroidism: Secondary | ICD-10-CM | POA: Diagnosis not present

## 2023-08-18 DIAGNOSIS — L0211 Cutaneous abscess of neck: Secondary | ICD-10-CM | POA: Diagnosis not present

## 2023-08-18 DIAGNOSIS — L089 Local infection of the skin and subcutaneous tissue, unspecified: Secondary | ICD-10-CM | POA: Diagnosis not present

## 2023-08-23 DIAGNOSIS — L0291 Cutaneous abscess, unspecified: Secondary | ICD-10-CM | POA: Diagnosis not present

## 2023-09-01 DIAGNOSIS — E038 Other specified hypothyroidism: Secondary | ICD-10-CM | POA: Diagnosis not present

## 2024-01-05 DIAGNOSIS — Z0000 Encounter for general adult medical examination without abnormal findings: Secondary | ICD-10-CM | POA: Diagnosis not present

## 2024-01-05 DIAGNOSIS — E038 Other specified hypothyroidism: Secondary | ICD-10-CM | POA: Diagnosis not present

## 2024-01-07 DIAGNOSIS — Z0000 Encounter for general adult medical examination without abnormal findings: Secondary | ICD-10-CM | POA: Diagnosis not present

## 2024-01-07 DIAGNOSIS — E038 Other specified hypothyroidism: Secondary | ICD-10-CM | POA: Diagnosis not present

## 2024-04-06 DIAGNOSIS — E038 Other specified hypothyroidism: Secondary | ICD-10-CM | POA: Diagnosis not present
